# Patient Record
Sex: Female | Born: 1980 | Race: Black or African American | Marital: Married | Smoking: Never smoker
Health system: Southern US, Community
[De-identification: ages and names within clinical notes are randomized; demographics above are authoritative.]

## PROBLEM LIST (undated history)

## (undated) ENCOUNTER — Inpatient Hospital Stay (HOSPITAL_COMMUNITY): Payer: Self-pay

## (undated) DIAGNOSIS — Z789 Other specified health status: Secondary | ICD-10-CM

---

## 2009-11-05 HISTORY — PX: CHOLECYSTECTOMY: SHX55

## 2013-02-03 ENCOUNTER — Other Ambulatory Visit: Payer: Self-pay | Admitting: Orthopedic Surgery

## 2013-02-03 DIAGNOSIS — M79672 Pain in left foot: Secondary | ICD-10-CM

## 2013-02-13 ENCOUNTER — Other Ambulatory Visit: Payer: Self-pay

## 2013-03-17 ENCOUNTER — Ambulatory Visit
Admission: RE | Admit: 2013-03-17 | Discharge: 2013-03-17 | Disposition: A | Discharge status: Home / Self Care | Payer: BC Managed Care – PPO | Source: Ambulatory Visit | Attending: Orthopedic Surgery | Admitting: Orthopedic Surgery

## 2013-03-17 DIAGNOSIS — M79672 Pain in left foot: Secondary | ICD-10-CM

## 2015-02-06 IMAGING — CT CT FOOT*L* W/O CM
5 of 6 series · 15 of 35 positions shown, 17 images · non-contrast
Comparison: None.

CLINICAL DATA: Dorsal and lateral foot pain for 1 year.

CT OF THE LEFT FOOT WITHOUT CONTRAST
TECHNIQUE: Multidetector CT imaging was performed according to the
standard protocol. Multiplanar CT image reconstructions were also
generated.

[Series 2: ankle/foot bone · axial · 0.54mm/px · z∈[-40,+2]mm · 2 of 53 slices shown]
[im 18/53  bone]
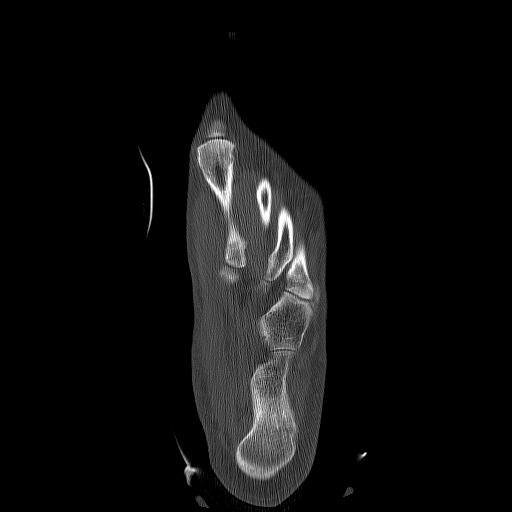
[im 35/53  bone]
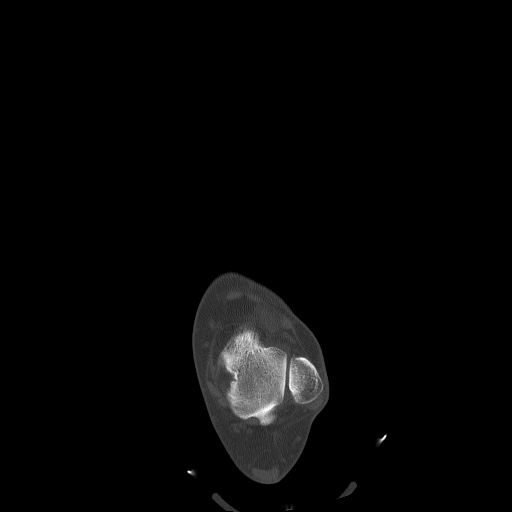

[Series 3: ankle /foot detail · axial · 0.54mm/px · z∈[-40,+2]mm · 2 of 53 slices shown]
[im 18/53  bone]
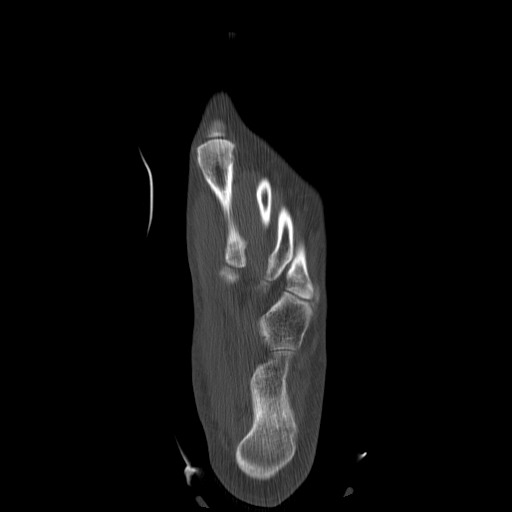
[im 35/53  bone]
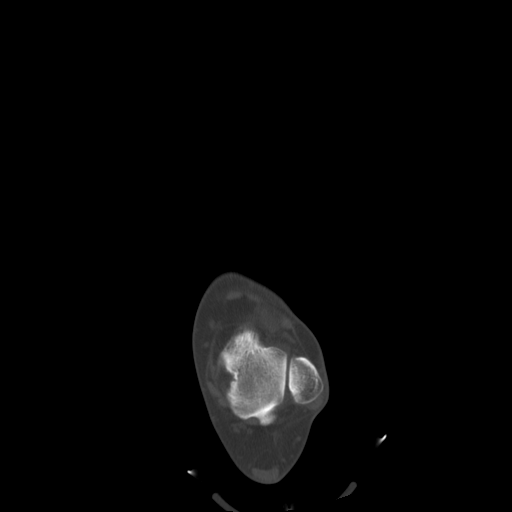

[Series 103: sag soft · sagittal · 0.54mm/px · 5 of 66 slices shown, 6 images]
[im 22/66  bone]
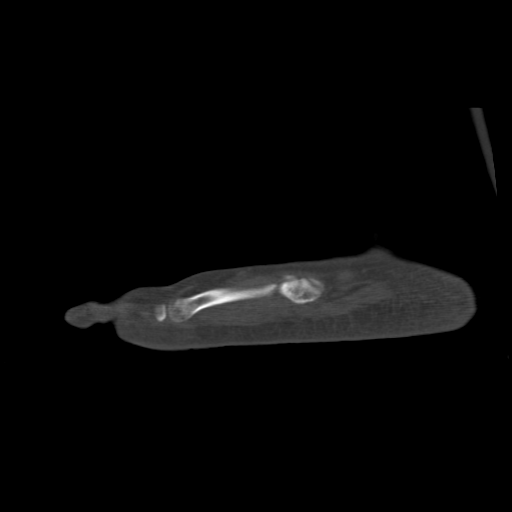
[im 28/66  bone]
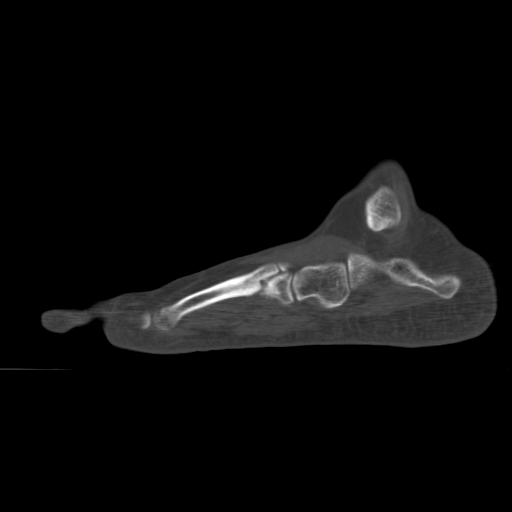
[im 33/66  soft-tissue]
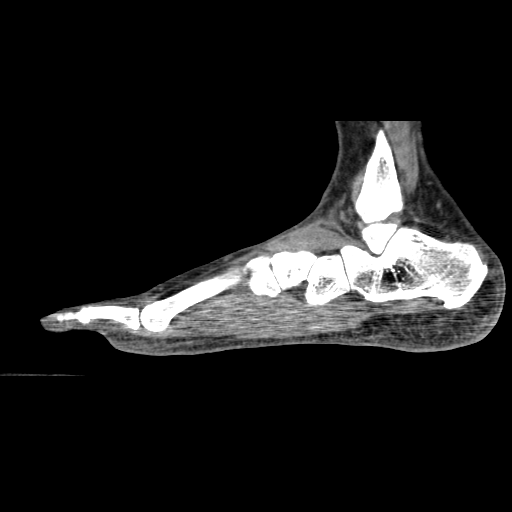
[im 33/66  bone]
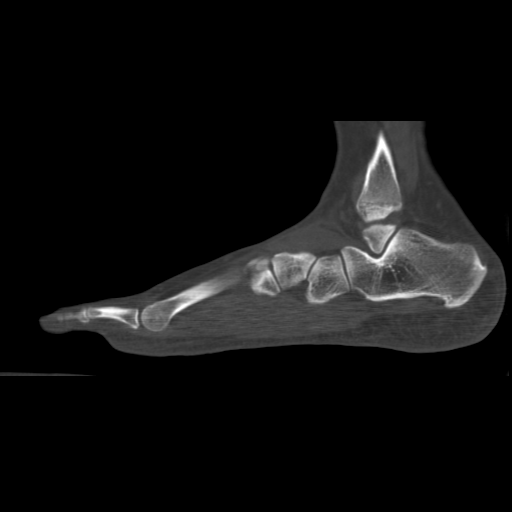
[im 38/66  bone]
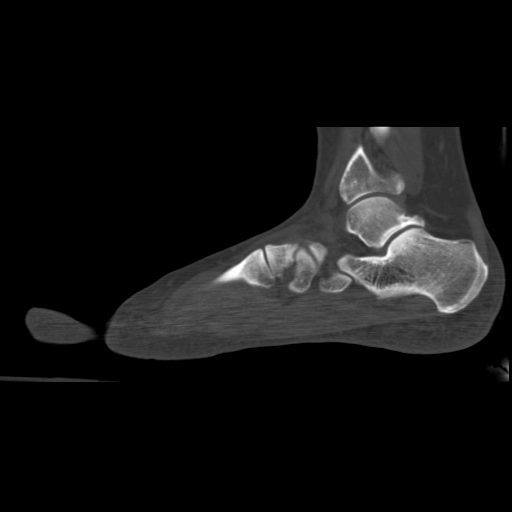
[im 44/66  bone]
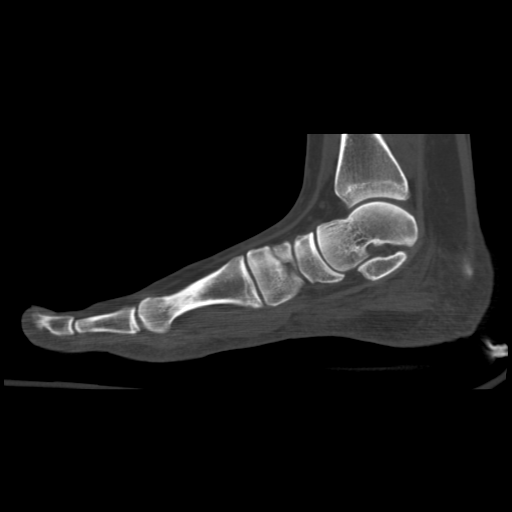

[Series 105: axial soft · coronal · 0.54mm/px · 3 of 132 slices shown]
[im 27/132  bone]
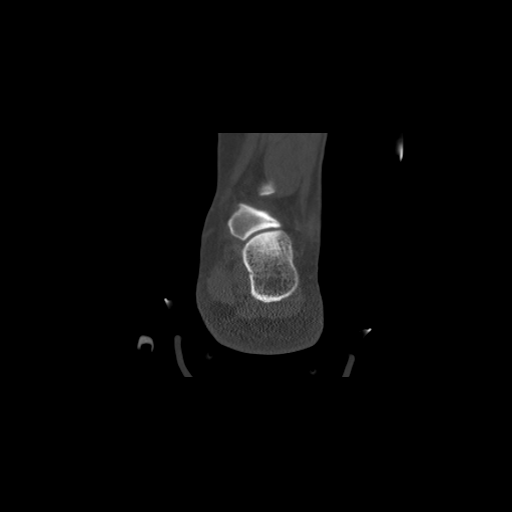
[im 53/132  bone]
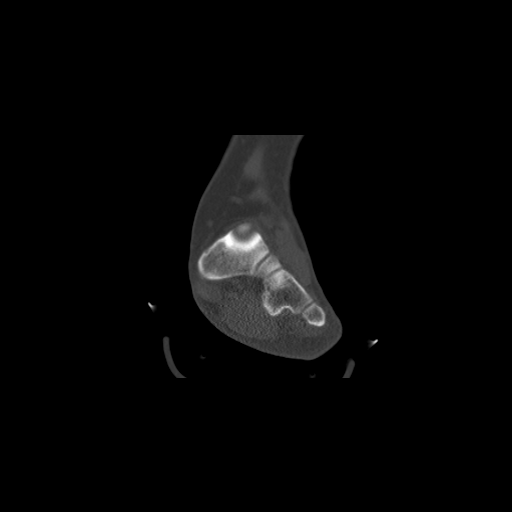
[im 79/132  bone]
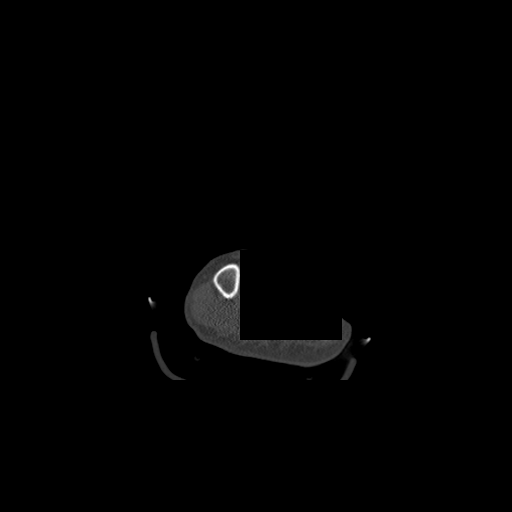

[Series 401: cor bone · axial · 0.54mm/px · z∈[-42,+121]mm · 3 of 49 slices shown, 4 images]
[im 1/49  soft-tissue]
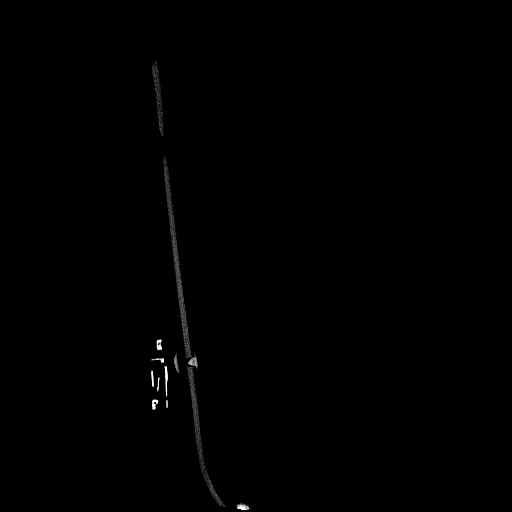
[im 1/49  bone]
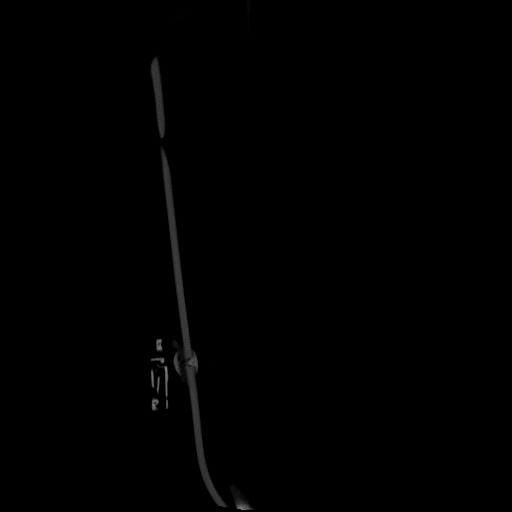
[im 25/49  bone]
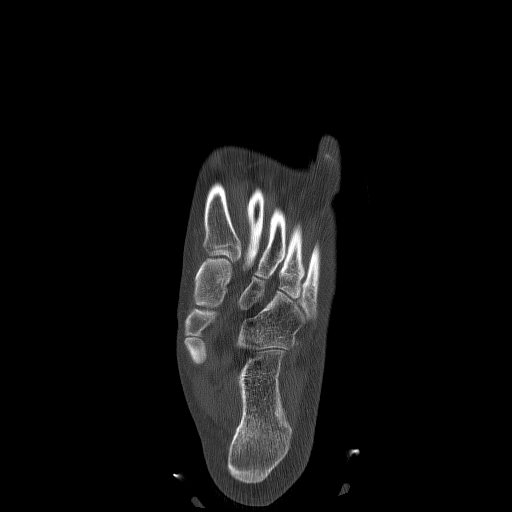
[im 49/49  bone]
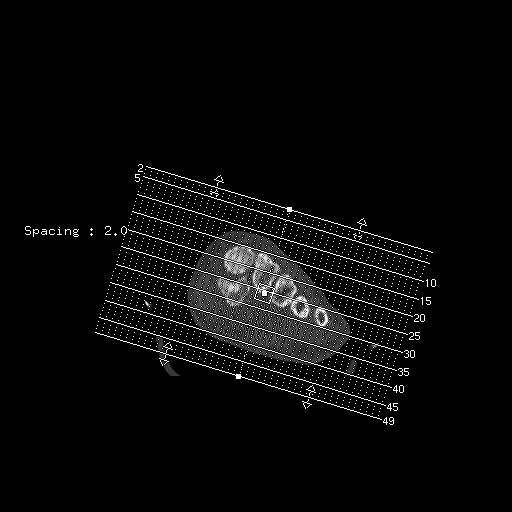

[15 of 35 positions shown; findings below may reference images not displayed]

FINDINGS: The osseous structures are normal with no evidence of
fibrous or osseous coalition.  The patient has a normal variant of
an os naviculare.  The tendons and ligaments of the medial and
lateral aspects of the ankle are normal.  The Achilles tendon and
the anterior tendons are normal.  Plantar fascia appears normal.
No calcaneal spur.

No joint effusions.  Tibiotalar joint is normal.
IMPRESSION: Normal CT scan of the left foot.  No evidence of coalition or other
significant abnormality.

## 2018-11-08 ENCOUNTER — Inpatient Hospital Stay (HOSPITAL_COMMUNITY): Payer: Medicaid Other

## 2018-11-08 ENCOUNTER — Other Ambulatory Visit: Payer: Self-pay

## 2018-11-08 ENCOUNTER — Inpatient Hospital Stay (HOSPITAL_COMMUNITY)
Admission: AD | Admit: 2018-11-08 | Discharge: 2018-11-08 | Disposition: A | Discharge status: Home / Self Care | Payer: Medicaid Other | Attending: Obstetrics & Gynecology | Admitting: Obstetrics & Gynecology

## 2018-11-08 ENCOUNTER — Encounter (HOSPITAL_COMMUNITY): Payer: Self-pay | Admitting: *Deleted

## 2018-11-08 DIAGNOSIS — Z6791 Unspecified blood type, Rh negative: Secondary | ICD-10-CM | POA: Diagnosis not present

## 2018-11-08 DIAGNOSIS — O26893 Other specified pregnancy related conditions, third trimester: Secondary | ICD-10-CM | POA: Diagnosis not present

## 2018-11-08 DIAGNOSIS — Z3A10 10 weeks gestation of pregnancy: Secondary | ICD-10-CM | POA: Diagnosis not present

## 2018-11-08 DIAGNOSIS — Z3A01 Less than 8 weeks gestation of pregnancy: Secondary | ICD-10-CM | POA: Diagnosis not present

## 2018-11-08 DIAGNOSIS — O209 Hemorrhage in early pregnancy, unspecified: Secondary | ICD-10-CM | POA: Diagnosis not present

## 2018-11-08 DIAGNOSIS — O200 Threatened abortion: Secondary | ICD-10-CM | POA: Insufficient documentation

## 2018-11-08 DIAGNOSIS — Z3491 Encounter for supervision of normal pregnancy, unspecified, first trimester: Secondary | ICD-10-CM

## 2018-11-08 DIAGNOSIS — O26891 Other specified pregnancy related conditions, first trimester: Secondary | ICD-10-CM | POA: Diagnosis not present

## 2018-11-08 HISTORY — DX: Other specified health status: Z78.9

## 2018-11-08 LAB — WET PREP, GENITAL
Clue Cells Wet Prep HPF POC: NONE SEEN
Sperm: NONE SEEN
Trich, Wet Prep: NONE SEEN
Yeast Wet Prep HPF POC: NONE SEEN

## 2018-11-08 LAB — CBC
HCT: 41.3 % (ref 36.0–46.0)
Hemoglobin: 13.7 g/dL (ref 12.0–15.0)
MCH: 28.5 pg (ref 26.0–34.0)
MCHC: 33.2 g/dL (ref 30.0–36.0)
MCV: 86 fL (ref 80.0–100.0)
NRBC: 0 % (ref 0.0–0.2)
Platelets: 319 10*3/uL (ref 150–400)
RBC: 4.8 MIL/uL (ref 3.87–5.11)
RDW: 12.8 % (ref 11.5–15.5)
WBC: 6.1 10*3/uL (ref 4.0–10.5)

## 2018-11-08 LAB — URINALYSIS, ROUTINE W REFLEX MICROSCOPIC
BACTERIA UA: NONE SEEN
BILIRUBIN URINE: NEGATIVE
Glucose, UA: NEGATIVE mg/dL
KETONES UR: NEGATIVE mg/dL
Leukocytes, UA: NEGATIVE
Nitrite: NEGATIVE
Protein, ur: NEGATIVE mg/dL
RBC / HPF: >50 RBC/hpf — ABNORMAL HIGH (ref 0–5)
Specific Gravity, Urine: 1.025 (ref 1.005–1.030)
pH: 6 (ref 5.0–8.0)

## 2018-11-08 LAB — HCG, QUANTITATIVE, PREGNANCY: hCG, Beta Chain, Quant, S: 5141 m[IU]/mL — ABNORMAL HIGH (ref <5)

## 2018-11-08 LAB — ABO/RH: ABO/RH(D): B NEG

## 2018-11-08 MED ORDER — RHO D IMMUNE GLOBULIN 1500 UNIT/2ML IJ SOSY
300.0000 ug | PREFILLED_SYRINGE | Freq: Once | INTRAMUSCULAR | Status: AC
  Administered 2018-11-08: 300 ug via INTRAMUSCULAR
  Filled 2018-11-08: qty 2

## 2018-11-08 NOTE — MAU Provider Note (Signed)
Chief Complaint: Vaginal Bleeding   First Provider Initiated Contact with Patient 11/08/18 1143      SUBJECTIVE HPI: Jennifer Jennings is a 38 y.o. G2P2002 at 9w by LMP who presents to maternity admissions reporting positive pregnancy test on November 25 and onset of spotting on 12/23, progressing to light red bleeding 2 days ago requiring a light pad.  The pad is not soaked, but she reports scant blood on her pad.  The bleeding is associated with mild lower abdominal cramping.  There are no other symptoms. She has not tried any treatments.    HPI  Past Medical History:  Diagnosis Date  . Medical history non-contributory    Past Surgical History:  Procedure Laterality Date  . CHOLECYSTECTOMY  2011   Social History   Socioeconomic History  . Marital status: Married    Spouse name: Not on file  . Number of children: Not on file  . Years of education: Not on file  . Highest education level: Not on file  Occupational History  . Not on file  Social Needs  . Financial resource strain: Not on file  . Food insecurity:    Worry: Not on file    Inability: Not on file  . Transportation needs:    Medical: Not on file    Non-medical: Not on file  Tobacco Use  . Smoking status: Never Smoker  . Smokeless tobacco: Never Used  Substance and Sexual Activity  . Alcohol use: Not Currently  . Drug use: Never  . Sexual activity: Not Currently    Birth control/protection: None  Lifestyle  . Physical activity:    Days per week: Not on file    Minutes per session: Not on file  . Stress: Not on file  Relationships  . Social connections:    Talks on phone: Not on file    Gets together: Not on file    Attends religious service: Not on file    Active member of club or organization: Not on file    Attends meetings of clubs or organizations: Not on file    Relationship status: Not on file  . Intimate partner violence:    Fear of current or ex partner: Not on file    Emotionally abused: Not  on file    Physically abused: Not on file    Forced sexual activity: Not on file  Other Topics Concern  . Not on file  Social History Narrative  . Not on file   No current facility-administered medications on file prior to encounter.    No current outpatient medications on file prior to encounter.   No Known Allergies  ROS:  Review of Systems  Constitutional: Negative for chills and fever.  Respiratory: Negative for cough and shortness of breath.   Cardiovascular: Negative for chest pain.  Gastrointestinal: Negative for nausea and vomiting.  Genitourinary: Positive for pelvic pain and vaginal bleeding. Negative for dysuria, frequency and urgency.  Musculoskeletal: Negative.   Neurological: Negative for dizziness and headaches.     I have reviewed patient's Past Medical Hx, Surgical Hx, Family Hx, Social Hx, medications and allergies.   Physical Exam   Patient Vitals for the past 24 hrs:  BP Temp Temp src Pulse Resp SpO2 Weight  11/08/18 1047 (!) 143/83 98.1 F (36.7 C) Oral 96 18 96 % 129.8 kg   Constitutional: Well-developed, well-nourished female in no acute distress.  Cardiovascular: normal rate Respiratory: normal effort GI: Abd soft, non-tender. Pos BS x 4  MS: Extremities nontender, no edema, normal ROM Neurologic: Alert and oriented x 4.  GU: Neg CVAT.  PELVIC EXAM: Cervix pink, visually closed, without lesion, moderate bleeding requiring 2 fox swabs to visualize cervix, vaginal walls and external genitalia normal Bimanual exam: Cervix 0/long/high, firm, anterior, neg CMT, uterus nontender, nonenlarged, adnexa without tenderness, enlargement, or mass   LAB RESULTS Results for orders placed or performed during the hospital encounter of 11/08/18 (from the past 24 hour(s))  CBC     Status: None   Collection Time: 11/08/18 11:52 AM  Result Value Ref Range   WBC 6.1 4.0 - 10.5 K/uL   RBC 4.80 3.87 - 5.11 MIL/uL   Hemoglobin 13.7 12.0 - 15.0 g/dL   HCT 16.141.3 09.636.0  - 04.546.0 %   MCV 86.0 80.0 - 100.0 fL   MCH 28.5 26.0 - 34.0 pg   MCHC 33.2 30.0 - 36.0 g/dL   RDW 40.912.8 81.111.5 - 91.415.5 %   Platelets 319 150 - 400 K/uL   nRBC 0.0 0.0 - 0.2 %  hCG, quantitative, pregnancy     Status: Abnormal   Collection Time: 11/08/18 11:52 AM  Result Value Ref Range   hCG, Beta Chain, Quant, S 5,141 (H) <5 mIU/mL  ABO/Rh     Status: None   Collection Time: 11/08/18 11:52 AM  Result Value Ref Range   ABO/RH(D)      B NEG Performed at [facility], [address] [place], [place] [address]   Rh IG workup (includes ABO/Rh)     Status: None (Preliminary result)   Collection Time: 11/08/18 11:52 AM  Result Value Ref Range   Gestational Age(Wks) 9    ABO/RH(D)      B NEG Performed at [facility], [address] [place], [place] [address]    Antibody Screen PENDING   Urinalysis, Routine w reflex microscopic     Status: Abnormal   Collection Time: 11/08/18 12:17 PM  Result Value Ref Range   Color, Urine YELLOW YELLOW   APPearance HAZY (A) CLEAR   Specific Gravity, Urine 1.025 1.005 - 1.030   pH 6.0 5.0 - 8.0   Glucose, UA NEGATIVE NEGATIVE mg/dL   Hgb urine dipstick LARGE (A) NEGATIVE   Bilirubin Urine NEGATIVE NEGATIVE   Ketones, ur NEGATIVE NEGATIVE mg/dL   Protein, ur NEGATIVE NEGATIVE mg/dL   Nitrite NEGATIVE NEGATIVE   Leukocytes, UA NEGATIVE NEGATIVE   RBC / HPF >50 (H) 0 - 5 RBC/hpf   WBC, UA 6-10 0 - 5 WBC/hpf   Bacteria, UA NONE SEEN NONE SEEN   Squamous Epithelial / LPF 0-5 0 - 5   Mucus PRESENT   Wet prep, genital     Status: Abnormal   Collection Time: 11/08/18  1:27 PM  Result Value Ref Range   Yeast Wet Prep HPF POC NONE SEEN NONE SEEN   Trich, Wet Prep NONE SEEN NONE SEEN   Clue Cells Wet Prep HPF POC NONE SEEN NONE SEEN   WBC, Wet Prep HPF POC FEW (A) NONE SEEN   Sperm NONE SEEN     --/--/B NEG Performed at [facility], [address] [place], [place] [address] , B NEG Performed at [facility], [address] [place], [place] [address]  [phone](01/04 1152)  IMAGING [place] Ob Less Than 14 Weeks With Ob Transvaginal  Result Date: 11/08/2018 CLINICAL DATA:  Vaginal bleeding. Pregnant patient. Beta HCG level 5,141. Patient is 10 weeks and 6 days pregnant based on the last  menstrual period. EXAM: OBSTETRIC <14 WK US AND TRANSVAGINAL OB US TECHNIQUE: Both transabdominal and transvaginal ultrasound examinations were performed for complete evaluation of the gestation as well as the maternal uterus, adnexal regions, and pelvic cul-de-sac. Transvaginal technique was performed to assess early pregnancy. COMPARISON:  None. FINDINGS: Intrauterine gestational sac: Single Yolk sac:  Visualized. Embryo:  Visualized. Cardiac Activity: Not Visualized. MSD: 24.6 mm   7 w   3 d CRL:  4.9 mm   6 w   1 d                  US EDC: 06/29/2019 Subchorionic hemorrhage:  None visualized. Maternal uterus/adnexae: No uterine masses. Cervix unremarkable. Normal ovaries and adnexa. No pelvic free fluid. IMPRESSION: 1. Gestational sac, yolk sac and small embryo, but no detectable cardiac activity. Findings are suspicious but not yet definitive for failed pregnancy. Recommend follow-up US in 10-14 days for definitive diagnosis. This recommendation follows SRU consensus guidelines: Diagnostic Criteria for Nonviable Pregnancy Early in the First Trimester. Malva Limes Engl J Med 2013; 161:0960-45; 369:1443-51. 2. No subchorionic hemorrhage or uterine abnormality. Normal ovaries and adnexa. Electronically Signed   By: Amie Portlandavid  Ormond M.D.   On: 11/08/2018 16:19    MAU Management/MDM: Ordered labs and US and reviewed results.  US indicates IUP but at 1322w1d, this is inconsistent with pt sure LMP and known conception dates.  Likely SAB but not definitive. Outpatient US in 10 days with [facility] for results.  Return to MAU sooner if increased pain or bleeding.  Blood type is B negative so rhophylac given in MAU today.  Pt discharged with strict return  precautions.  ASSESSMENT 1. Threatened miscarriage in early pregnancy   2. Vaginal bleeding in pregnancy, first trimester   3. Normal IUP (intrauterine pregnancy) on prenatal ultrasound, first trimester   4. Rh negative state in antepartum period, first trimester     PLAN Discharge home Allergies as of 11/08/2018   No Known Allergies     Medication List    You have not been prescribed any medications.    Follow-up Information    [facility] Follow up.   Specialty:  Radiology Why:  Ultrasound will call you to schedule follow up imaging in 10 days.   Contact information: [address] [id number] [place] [address] [phone]       [facility] Follow up.   Why:  Return to MAU as needed for emergencies. Contact information: [address] [place] [phone]          Sharen CounterLisa Leftwich-Kirby Certified Nurse-Midwife 11/08/2018  6:20 PM

## 2018-11-08 NOTE — MAU Note (Addendum)
Jennifer Jennings is a 38 y.o.  here in MAU reporting: +vaginal bleeding. Started as spotting now more and noticeable each times she goes to the restroom. Tan in color to red now. LMP: 08/24/18 Onset of complaint: started 12/27 Pain score: denies at this time. Does report menstrual like cramps at night. Vitals:   11/08/18 1047  BP: (!) 143/83  Pulse: 96  Resp: 18  Temp: 98.1 F (36.7 C)  SpO2: 96%    1st appt set with [facility] on the 22nd (currently awaiting insurance). Denies being seen in the last year. Lab orders placed from triage: ua and pregnancy

## 2018-11-09 LAB — RH IG WORKUP (INCLUDES ABO/RH)
ABO/RH(D): B NEG
Antibody Screen: NEGATIVE
Gestational Age(Wks): 9
Unit division: 0

## 2018-11-09 LAB — HIV ANTIBODY (ROUTINE TESTING W REFLEX): HIV SCREEN 4TH GENERATION: NONREACTIVE

## 2018-11-10 ENCOUNTER — Inpatient Hospital Stay (HOSPITAL_COMMUNITY)
Admission: AD | Admit: 2018-11-10 | Discharge: 2018-11-10 | Disposition: A | Discharge status: Home / Self Care | Payer: Medicaid Other | Source: Ambulatory Visit | Attending: Family Medicine | Admitting: Family Medicine

## 2018-11-10 ENCOUNTER — Encounter (HOSPITAL_COMMUNITY): Payer: Self-pay | Admitting: *Deleted

## 2018-11-10 ENCOUNTER — Other Ambulatory Visit: Payer: Self-pay

## 2018-11-10 DIAGNOSIS — O209 Hemorrhage in early pregnancy, unspecified: Secondary | ICD-10-CM | POA: Diagnosis present

## 2018-11-10 DIAGNOSIS — O039 Complete or unspecified spontaneous abortion without complication: Secondary | ICD-10-CM

## 2018-11-10 LAB — CBC
HCT: 40.4 % (ref 36.0–46.0)
HEMOGLOBIN: 13.2 g/dL (ref 12.0–15.0)
MCH: 28.1 pg (ref 26.0–34.0)
MCHC: 32.7 g/dL (ref 30.0–36.0)
MCV: 86.1 fL (ref 80.0–100.0)
Platelets: 309 10*3/uL (ref 150–400)
RBC: 4.69 MIL/uL (ref 3.87–5.11)
RDW: 12.8 % (ref 11.5–15.5)
WBC: 6.6 10*3/uL (ref 4.0–10.5)
nRBC: 0 % (ref 0.0–0.2)

## 2018-11-10 LAB — GC/CHLAMYDIA PROBE AMP (~~LOC~~) NOT AT ARMC
Chlamydia: NEGATIVE
Neisseria Gonorrhea: NEGATIVE

## 2018-11-10 LAB — HCG, QUANTITATIVE, PREGNANCY: hCG, Beta Chain, Quant, S: 2347 m[IU]/mL — ABNORMAL HIGH (ref <5)

## 2018-11-10 NOTE — MAU Provider Note (Addendum)
History     CSN: [id number]  Arrival date and time: 11/10/18 1445   First Provider Initiated Contact with Patient 11/10/18 1558      Chief Complaint  Patient presents with  . Vaginal Bleeding   Jennifer Jennings is a 38y.o. G3P2002 at 6.3wks who presents for possible miscarriage.  She reports passing of gestational sac early afternoon (1215).  She denies pain and reports that her bleeding has "lightened up" since passing of the gestational sac.  Patient reports that she brought in the gestational sac.  She denies nausea/vomiting, problems with urination, or constipation/diarrhea.      OB History    Gravida  3   Para  2   Term  2   Preterm      AB      Living  2     SAB      TAB      Ectopic      Multiple      Live Births  2           Past Medical History:  Diagnosis Date  . Medical history non-contributory     Past Surgical History:  Procedure Laterality Date  . CHOLECYSTECTOMY  2011    History reviewed. No pertinent family history.  Social History   Tobacco Use  . Smoking status: Never Smoker  . Smokeless tobacco: Never Used  Substance Use Topics  . Alcohol use: Not Currently  . Drug use: Never    Allergies:  Allergies  Allergen Reactions  . Adhesive [Tape] Rash    No medications prior to admission.    Review of Systems  Constitutional: Negative for chills and fever.  Respiratory: Negative for shortness of breath.   Gastrointestinal: Positive for abdominal pain. Negative for constipation, diarrhea, nausea and vomiting.  Genitourinary: Positive for vaginal bleeding. Negative for difficulty urinating, dysuria, pelvic pain and vaginal pain.  Neurological: Negative for dizziness and light-headedness.   Physical Exam   Blood pressure 135/84, pulse 92, temperature 98.1 F (36.7 C), temperature source Oral, resp. rate 20, height 5\' 6"  (1.676 m), weight 130.3 kg, last menstrual period 08/24/2018, SpO2 100 %.  Physical Exam   Constitutional: She is oriented to person, place, and time. She appears well-developed and well-nourished.  HENT:  Head: Normocephalic and atraumatic.  Eyes: Conjunctivae are normal.  Neck: Normal range of motion.  Cardiovascular: Normal rate, regular rhythm and normal heart sounds.  Respiratory: Effort normal and breath sounds normal.  GI: Soft. Bowel sounds are normal.  Musculoskeletal: Normal range of motion.  Neurological: She is alert and oriented to person, place, and time.  Skin: Skin is warm and dry.  Psychiatric: She has a normal mood and affect. Her behavior is normal.   Results for orders placed or performed during the hospital encounter of 11/10/18 (from the past 24 hour(s))  CBC     Status: None   Collection Time: 11/10/18  4:15 PM  Result Value Ref Range   WBC 6.6 4.0 - 10.5 K/uL   RBC 4.69 3.87 - 5.11 MIL/uL   Hemoglobin 13.2 12.0 - 15.0 g/dL   HCT 54.2 70.6 - 23.7 %   MCV 86.1 80.0 - 100.0 fL   MCH 28.1 26.0 - 34.0 pg   MCHC 32.7 30.0 - 36.0 g/dL   RDW 62.8 31.5 - 17.6 %   Platelets 309 150 - 400 K/uL   nRBC 0.0 0.0 - 0.2 %  hCG, quantitative, pregnancy     Status: Abnormal  Collection Time: 11/10/18  4:15 PM  Result Value Ref Range   hCG, Beta Chain, Quant, S 2,347 (H) <5 mIU/mL    MAU Course  Procedures  MDM Pelvic Exam Labs: UA, CBC, hCG   Assessment and Plan  Complete Miscarriage  -Condolences given -Pelvic exam declined -UA deferred due to inadequate specimen -CBC and hCG pending -Patient requests to keep gestational sac for home burial services -Picture of gestational sac taken and added to media -Patient requests picture, from original ultrasound, and tech contacted. -Patient offered and declined pain medication -Informed of need to follow up for repeat hCG in ~2 weeks in clinic -Will send message for scheduling of follow up appt. -Discharged to home in stable condition  Cherre Robins MSN, CNM 11/10/2018, 3:59 PM

## 2018-11-10 NOTE — Discharge Instructions (Signed)
Coping with Pregnancy Loss  Pregnancy loss can happen any time during a pregnancy. Often the cause is not known. It is rarely because of anything you did. Pregnancy loss in early pregnancy (during the first trimester) is called a miscarriage. This type of pregnancy loss is the most common. Pregnancy loss that happens after 20 weeks of pregnancy is called fetal demise if the baby's heart stops beating before birth. Fetal demise is much less common. Some women experience spontaneous labor shortly after fetal demise resulting in a stillborn birth (stillbirth).  Any pregnancy loss can be devastating. You will need to recover both physically and emotionally. Most women are able to get pregnant again after a pregnancy loss and deliver a healthy baby.  How to manage emotional recovery    Pregnancy loss is very hard emotionally. You may feel many different emotions while you grieve. You may feel sad and angry. You may also feel guilty. It is normal to have periods of crying. Emotional recovery can take longer than physical recovery. It is different for everyone.  Taking these steps can help you cope:  · Remember that it is unlikely you did anything to cause the pregnancy loss.  · Share your thoughts and feelings with friends, family, and your partner. Remember that your partner is also recovering emotionally.  · Make sure you have a good support system, and do not spend too much time alone.  · Meet with a pregnancy loss counselor or join a pregnancy loss support group.  · Get enough sleep and eat a healthy diet. Return to regular exercise when you have recovered physically.  · Do not use drugs or alcohol to manage your emotions.  · Consider seeing a mental health professional to help you recover emotionally.  · Ask a friend or loved one to help you decide what to do with any clothing and nursery items you received for your baby.  In the case of a stillbirth, many women benefit from taking additional steps in the grieving  process. You may want to:  · Hold your baby after the birth.  · Name your baby.  · Request a birth certificate.  · Create a keepsake such as handprints or footprints.  · Dress your baby and have a picture taken.  · Make funeral arrangements.  · Ask for a baptism or blessing.  Hospitals have staff members who can help you with all these arrangements.  How to recognize emotional stress  It is normal to have emotional stress after a pregnancy loss. But emotional stress that lasts a long time or becomes severe requires treatment. Watch out for these signs of severe emotional stress:  · Sadness, anger, or guilt that is not going away and is interfering with your normal activities.  · Relationship problems that have occurred or gotten worse since the pregnancy loss.  · Signs of depression that last longer than 2 weeks. These may include:  ? Sadness.  ? Anxiety.  ? Hopelessness.  ? Loss of interest in activities you enjoy.  ? Inability to concentrate.  ? Trouble sleeping or sleeping too much.  ? Loss of appetite or overeating.  ? Thoughts of death or of hurting yourself.  Follow these instructions at home:  Medicines  · Take over-the-counter and prescription medicines only as told by your health care provider.  Activity  · Rest at home until your energy level returns. Return to your normal activities as told by your health care provider. Ask your health care   provider what activities are safe for you.  General instructions  · Keep all follow-up visits as told by your health care provider. This is important.  · It may be helpful to meet with others who have experienced pregnancy loss. Ask your health care provider about support groups and resources.  · To help you and your partner with the process of grieving, talk with your health care provider or seek counseling.  · When you are ready, meet with your health care provider to discuss steps to take for a future pregnancy.  Where to find more information  · U.S. Department of  Health and Human Services Office on Women's Health: www.womenshealth.gov  · American Pregnancy Association: www.americanpregnancy.org  Contact a health care provider if:  · You continue to experience grief, sadness, or lack of motivation for everyday activities, and those feelings do not improve over time.  · You are struggling to recover emotionally, especially if you are using alcohol or substances to help.  Get help right away if:  · You have thoughts of hurting yourself or others.  If you ever feel like you may hurt yourself or others, or have thoughts about taking your own life, get help right away. You can go to your nearest emergency department or call:  · Your local emergency services (911 in the [place]  · A suicide crisis helpline, such as the National Suicide Prevention Lifeline at 1-800-273-8255. This is open 24 hours a day.  Summary  · Any pregnancy loss can be difficult physically and emotionally.  · You may experience many different emotions while you grieve. Emotional recovery can last longer than physical recovery.  · It is normal to have emotional stress after a pregnancy loss. But emotional stress that lasts a long time or becomes severe requires treatment.  · See your health care provider if you are struggling emotionally after a pregnancy loss.  This information is not intended to replace advice given to you by your health care provider. Make sure you discuss any questions you have with your health care provider.  Document Released: 01/02/2018 Document Revised: 01/02/2018 Document Reviewed: 01/02/2018  Elsevier Interactive Patient Education © 2019 Elsevier Inc.

## 2018-11-10 NOTE — MAU Note (Signed)
Pt presents with c/o miscarriage this morning.  Reports passed sac @ 1150.  States was seen Saturday, had U/S, reports was measuring 6 weeks 1 day and should have been @ least 9 weeks. Reports moderate amount VB, passing clots

## 2018-11-17 ENCOUNTER — Ambulatory Visit: Payer: Self-pay | Admitting: *Deleted

## 2018-11-17 DIAGNOSIS — O039 Complete or unspecified spontaneous abortion without complication: Secondary | ICD-10-CM | POA: Diagnosis not present

## 2018-11-18 LAB — BETA HCG QUANT (REF LAB): hCG Quant: 71 m[IU]/mL

## 2018-11-25 ENCOUNTER — Ambulatory Visit (INDEPENDENT_AMBULATORY_CARE_PROVIDER_SITE_OTHER): Payer: Medicaid Other | Admitting: Advanced Practice Midwife

## 2018-11-25 ENCOUNTER — Encounter: Payer: Self-pay | Admitting: Advanced Practice Midwife

## 2018-11-25 VITALS — BP 150/92 | HR 86 | Wt 288.0 lb

## 2018-11-25 DIAGNOSIS — O039 Complete or unspecified spontaneous abortion without complication: Secondary | ICD-10-CM

## 2018-11-25 DIAGNOSIS — R030 Elevated blood-pressure reading, without diagnosis of hypertension: Secondary | ICD-10-CM | POA: Diagnosis not present

## 2018-11-25 DIAGNOSIS — Z832 Family history of diseases of the blood and blood-forming organs and certain disorders involving the immune mechanism: Secondary | ICD-10-CM

## 2018-11-25 DIAGNOSIS — Z8759 Personal history of other complications of pregnancy, childbirth and the puerperium: Secondary | ICD-10-CM

## 2018-11-25 NOTE — Progress Notes (Signed)
Patient is here to F/U Miscarriage on 11/10/18 B/P Elevated pt states she has no complaints and she feels fine. Pt denies any pain or bleeding pt states she never experienced any pain  During SAB either.

## 2018-11-25 NOTE — Progress Notes (Signed)
  GYNECOLOGY PROGRESS NOTE  History:  38 y.o. G3P2002 presents to [facility] office today for miscarriage follow up visit. She initially presented 11/08/18 with bleeding, [place] confirmed IUP at [redacted]w[redacted]d, without FHR. Dates were inconsistent with pt sure LMP. Quant hcg on 1/6 was 5,147. Pt presented again to MAU on 11/10/18 after passing gestational sac at home.  Quant hcg dropped to 2,347 on 11/08/18, c/w SAB.  She returned to the office 11/17/18 and had quant hcg down to 71.    She reports no pain or bleeding today. She has no hx of HTN and denies any h/a, chest pain, shortness of breath or dizziness.  She does report concerns that she has a family history of DVT/PE and her father and older sister have tested positive for a genetic mutation increasing risk of blood clots.  She is starting some lifestyle changes including exercise and diet this week but wants to get testing for this gene mutation if possible.    The following portions of the patient's history were reviewed and updated as appropriate: allergies, current medications, past family history, past medical history, past social history, past surgical history and problem list.  Review of Systems:  Pertinent items are noted in HPI.   Objective:  Physical Exam Blood pressure (!) 150/92, pulse 86, weight 130.6 kg, last menstrual period 08/31/2018.   VS reviewed, nursing note reviewed,  Constitutional: well developed, well nourished, no distress HEENT: normocephalic CV: normal rate Pulm/chest wall: normal effort Breast Exam: deferred Abdomen: soft Neuro: alert and oriented x 3 Skin: warm, dry Psych: affect normal Pelvic exam: Cervix pink, visually closed, without lesion, scant white creamy discharge, vaginal walls and external genitalia normal Bimanual exam: Cervix 0/long/high, firm, anterior, neg CMT, uterus nontender, nonenlarged, adnexa without tenderness, enlargement, or mass  Assessment & Plan:  1. SAB (spontaneous abortion) --Pt doing well with  no pain or bleeding.  Last hcg 71 so likely last draw today.   - Beta hCG quant (ref lab)  2. Family history of bleeding or clotting disorder - Prothrombin Gene Mutation  3. Transient hypertension --BP elevated, borderline at initial presentation for SAB on 11/08/18 but high normal on 11/10/18.  Elevated today. --Pt does not have primary care due to lack of insurance --Encouraged obtaining primary care provider --Pt reports weight gain since miscarriage due to stress eating and thinks this is contributing. She is working on this with diet and exercise.   Sharen Counter, CNM 3:02 PM

## 2018-11-25 NOTE — Patient Instructions (Signed)
Miscarriage  A miscarriage is the loss of an unborn baby (fetus) before the 20th week of pregnancy. Most miscarriages happen during the first 3 months of pregnancy. Sometimes, a miscarriage can happen before a woman knows that she is pregnant.  Having a miscarriage can be an emotional experience. If you have had a miscarriage, talk with your health care provider about any questions you may have about miscarrying, the grieving process, and your plans for future pregnancy.  What are the causes?  A miscarriage may be caused by:  · Problems with the genes or chromosomes of the fetus. These problems make it impossible for the baby to develop normally. They are often the result of random errors that occur early in the development of the baby, and are not passed from parent to child (not inherited).  · Infection of the cervix or uterus.  · Conditions that affect hormone balance in the body.  · Problems with the cervix, such as the cervix opening and thinning before pregnancy is at term (cervical insufficiency).  · Problems with the uterus. These may include:  ? A uterus with an abnormal shape.  ? Fibroids in the uterus.  ? Congenital abnormalities. These are problems that were present at birth.  · Certain medical conditions.  · Smoking, drinking alcohol, or using drugs.  · Injury (trauma).  In many cases, the cause of a miscarriage is not known.  What are the signs or symptoms?  Symptoms of this condition include:  · Vaginal bleeding or spotting, with or without cramps or pain.  · Pain or cramping in the abdomen or lower back.  · Passing fluid, tissue, or blood clots from the vagina.  How is this diagnosed?  This condition may be diagnosed based on:  · A physical exam.  · Ultrasound.  · Blood tests.  · Urine tests.  How is this treated?  Treatment for a miscarriage is sometimes not necessary if you naturally pass all the tissue that was in your uterus. If necessary, this condition may be treated with:  · Dilation and  curettage (D&C). This is a procedure in which the cervix is stretched open and the lining of the uterus (endometrium) is scraped. This is done only if tissue from the fetus or placenta remains in the body (incomplete miscarriage).  · Medicines, such as:  ? Antibiotic medicine, to treat infection.  ? Medicine to help the body pass any remaining tissue.  ? Medicine to reduce (contract) the size of the uterus. These medicines may be given if you have a lot of bleeding.  If you have Rh negative blood and your baby was Rh positive, you will need a shot of a medicine called Rh immunoglobulinto protect your future babies from Rh blood problems. "Rh-negative" and "Rh-positive" refer to whether or not the blood has a specific protein found on the surface of red blood cells (Rh factor).  Follow these instructions at home:  Medicines    · Take over-the-counter and prescription medicines only as told by your health care provider.  · If you were prescribed antibiotic medicine, take it as told by your health care provider. Do not stop taking the antibiotic even if you start to feel better.  · Do not take NSAIDs, such as aspirin and ibuprofen, unless they are approved by your health care provider. These medicines can cause bleeding.  Activity  · Rest as directed. Ask your health care provider what activities are safe for you.  · Have someone   help with home and family responsibilities during this time.  General instructions  · Keep track of the number of sanitary pads you use each day and how soaked (saturated) they are. Write down this information.  · Monitor the amount of tissue or blood clots that you pass from your vagina. Save any large amounts of tissue for your health care provider to examine.  · Do not use tampons, douche, or have sex until your health care provider approves.  · To help you and your partner with the process of grieving, talk with your health care provider or seek counseling.  · When you are ready, meet with  your health care provider to discuss any important steps you should take for your health. Also, discuss steps you should take to have a healthy pregnancy in the future.  · Keep all follow-up visits as told by your health care provider. This is important.  Where to find more information  · The [employer]: www.acog.org  · [place] Department of Health and Human Services Office of Women’s Health: www.womenshealth.gov  Contact a health care provider if:  · You have a fever or chills.  · You have a foul smelling vaginal discharge.  · You have more bleeding instead of less.  Get help right away if:  · You have severe cramps or pain in your back or abdomen.  · You pass blood clots or tissue from your vagina that is walnut-sized or larger.  · You soak more than 1 regular sanitary pad in an hour.  · You become light-headed or weak.  · You pass out.  · You have feelings of sadness that take over your thoughts, or you have thoughts of hurting yourself.  Summary  · Most miscarriages happen in the first 3 months of pregnancy. Sometimes miscarriage happens before a woman even knows that she is pregnant.  · Follow your health care provider's instruction for home care. Keep all follow-up appointments.  · To help you and your partner with the process of grieving, talk with your health care provider or seek counseling.  This information is not intended to replace advice given to you by your health care provider. Make sure you discuss any questions you have with your health care provider.  Document Released: 04/17/2001 Document Revised: 11/27/2016 Document Reviewed: 11/27/2016  [employer]vier Interactive Patient Education © 2019 [employer]

## 2018-12-02 LAB — PROTHROMBIN GENE MUTATION

## 2018-12-02 LAB — BETA HCG QUANT (REF LAB): hCG Quant: 7 m[IU]/mL

## 2019-09-25 ENCOUNTER — Encounter (HOSPITAL_COMMUNITY): Payer: Self-pay

## 2020-09-29 IMAGING — US US OB < 14 WEEKS - US OB TV
1 series · 15 of 28 positions shown · non-contrast
Comparison: None.

CLINICAL DATA: Vaginal bleeding. Pregnant patient. Beta HCG level
[DATE]. Patient is 10 weeks and 6 days pregnant based on the last
menstrual period.

EXAM:
OBSTETRIC <14 WK US AND TRANSVAGINAL OB US
TECHNIQUE: Both transabdominal and transvaginal ultrasound examinations were
performed for complete evaluation of the gestation as well as the
maternal uterus, adnexal regions, and pelvic cul-de-sac.
Transvaginal technique was performed to assess early pregnancy.

[Series 1: us ob < 14 weeks - us ob tv · 15 of 65 slices shown]
[im 1/65]
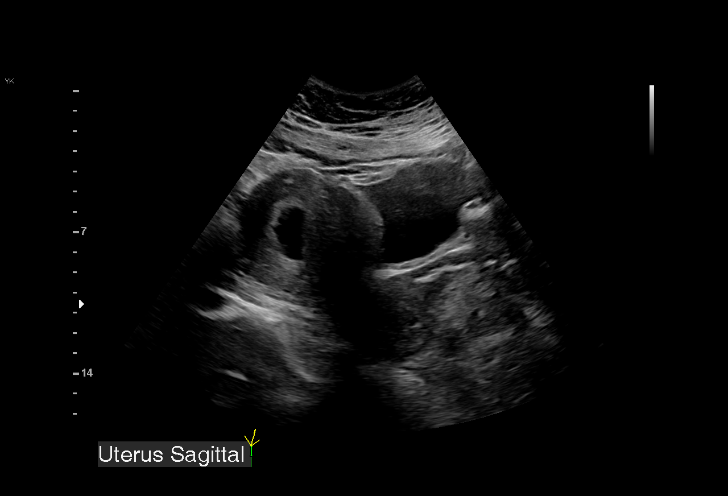
[im 5/65]
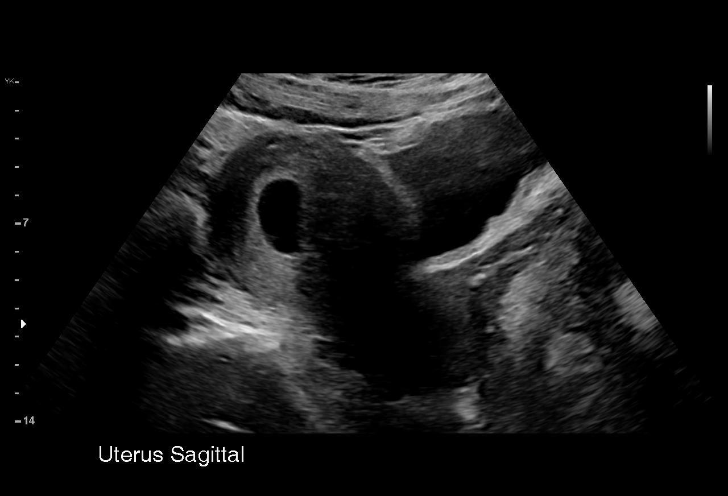
[im 10/65]
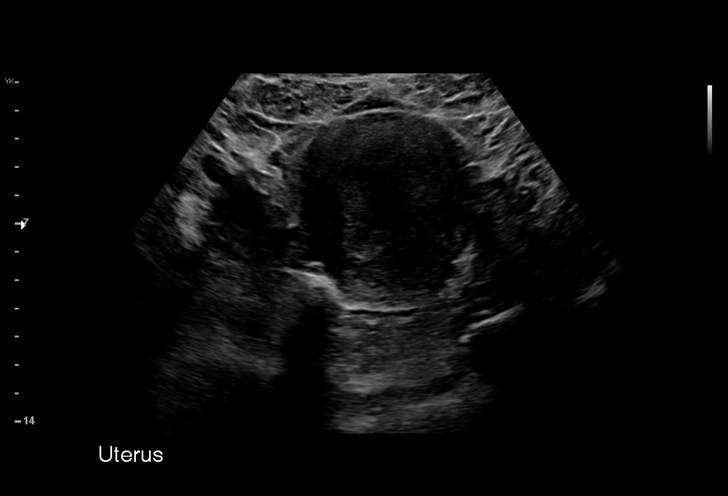
[im 15/65]
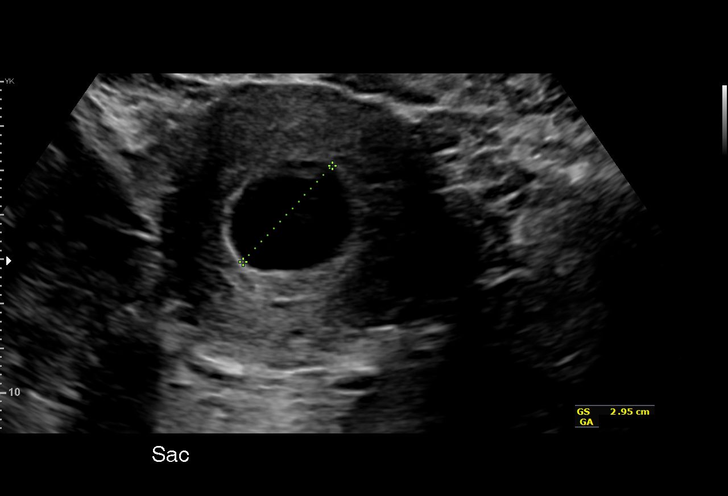
[im 19/65]
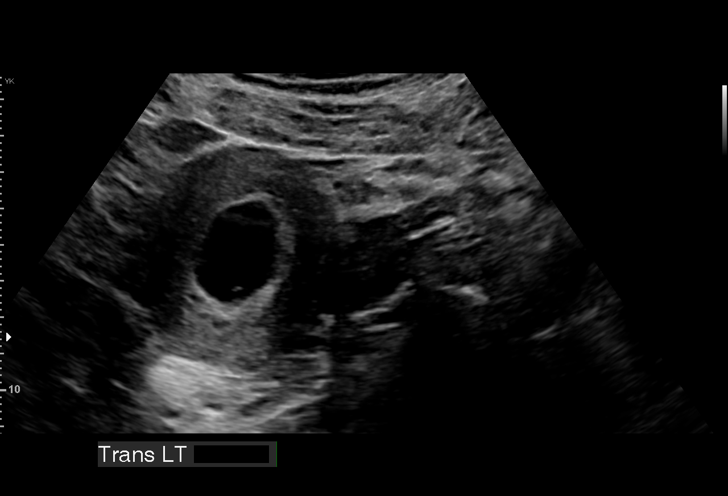
[im 24/65]
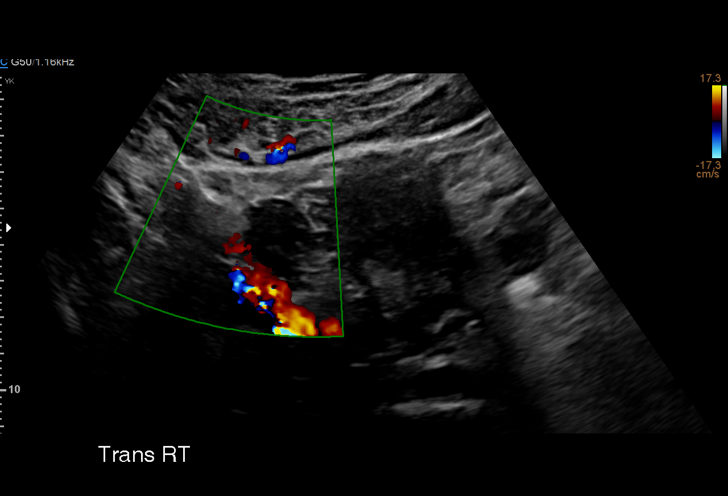
[im 29/65]
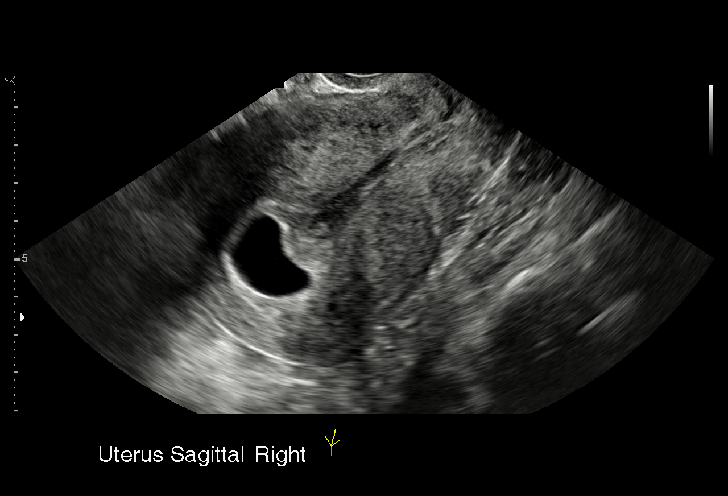
[im 34/65]
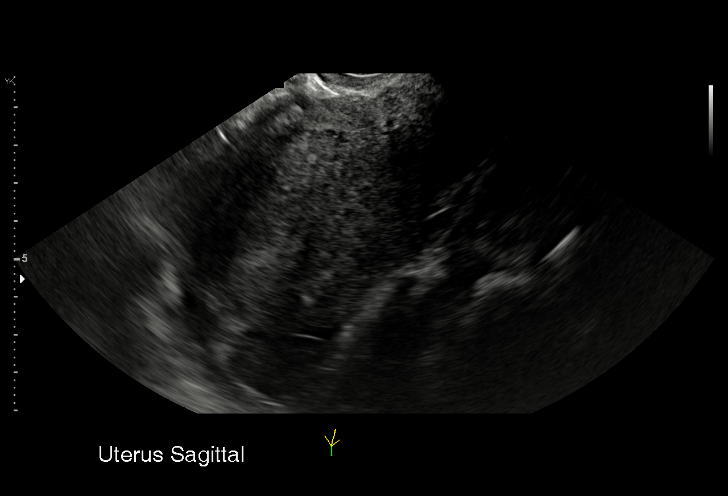
[im 36/65]
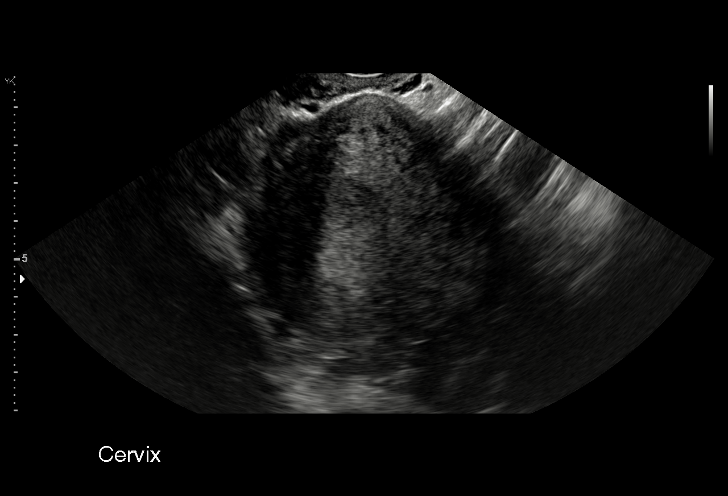
[im 41/65]
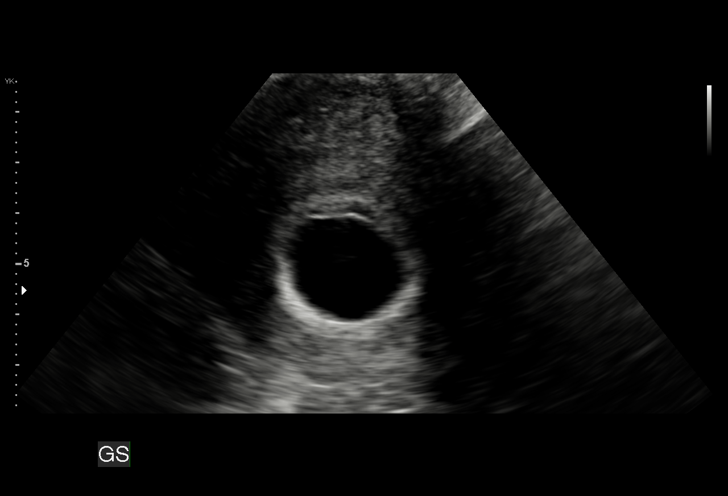
[im 46/65]
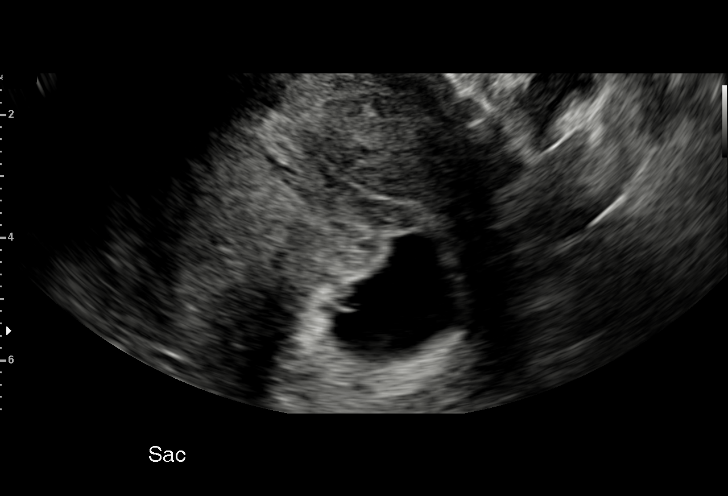
[im 50/65]
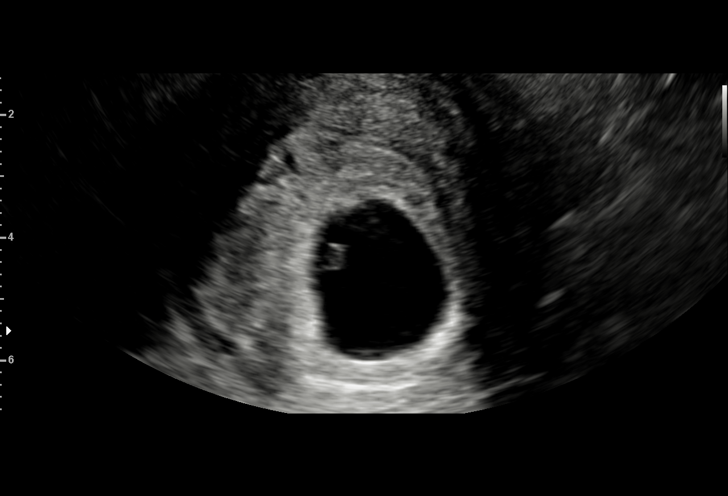
[im 55/65]
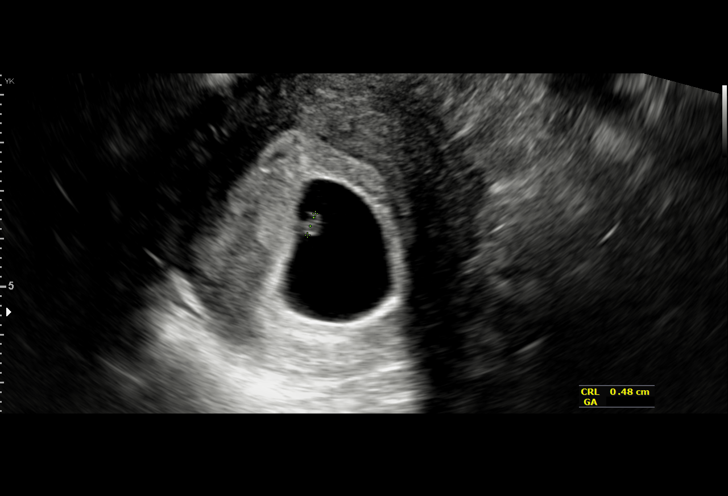
[im 60/65]
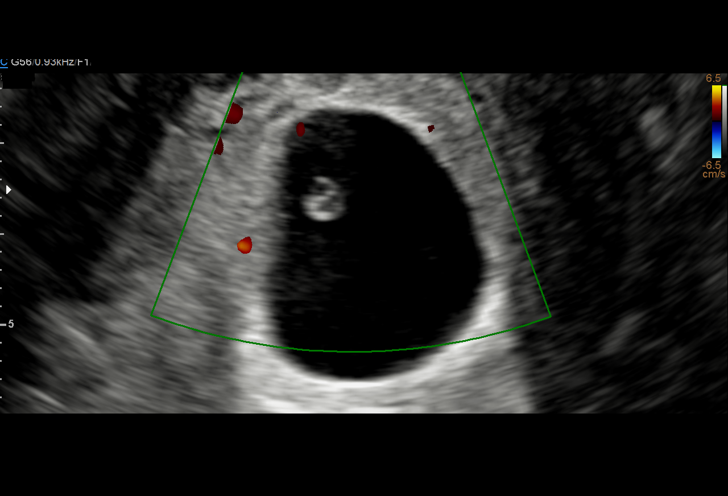
[im 65/65]
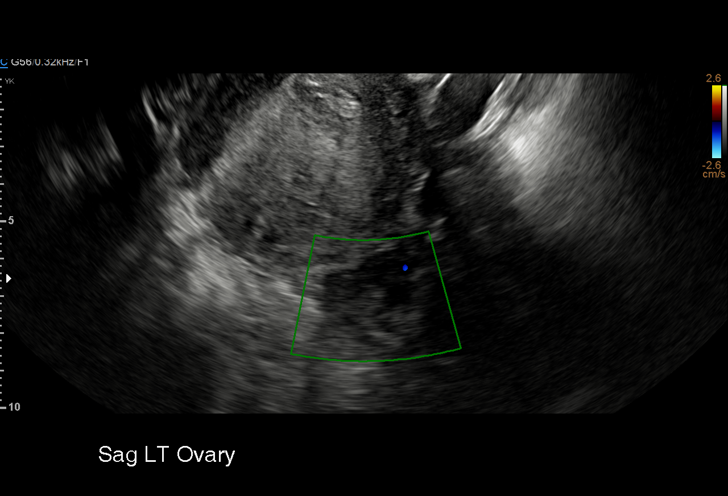

[15 of 28 positions shown; findings below may reference images not displayed]

FINDINGS: Intrauterine gestational sac: Single

Yolk sac:  Visualized.

Embryo:  Visualized.

Cardiac Activity: Not Visualized.

MSD: 24.6 mm   7 w   3 d

CRL:  4.9 mm   6 w   1 d                  US EDC: 06/29/2019

Subchorionic hemorrhage:  None visualized.

Maternal uterus/adnexae: No uterine masses. Cervix unremarkable.
Normal ovaries and adnexa. No pelvic free fluid.
IMPRESSION: 1. Gestational sac, yolk sac and small embryo, but no detectable
cardiac activity. Findings are suspicious but not yet definitive for
failed pregnancy. Recommend follow-up US in 10-14 days for
definitive diagnosis. This recommendation follows [facility] consensus
guidelines: Diagnostic Criteria for Nonviable Pregnancy Early in the
First Trimester. N Engl J Med 9247; [DATE].
2. No subchorionic hemorrhage or uterine abnormality. Normal ovaries
and adnexa.

## 2022-05-11 DIAGNOSIS — Z6841 Body Mass Index (BMI) 40.0 and over, adult: Secondary | ICD-10-CM | POA: Diagnosis not present

## 2022-05-11 DIAGNOSIS — R635 Abnormal weight gain: Secondary | ICD-10-CM | POA: Diagnosis not present

## 2022-05-11 DIAGNOSIS — R5383 Other fatigue: Secondary | ICD-10-CM | POA: Diagnosis not present

## 2022-05-11 DIAGNOSIS — L659 Nonscarring hair loss, unspecified: Secondary | ICD-10-CM | POA: Diagnosis not present

## 2022-05-11 DIAGNOSIS — Z130 Encounter for screening for diseases of the blood and blood-forming organs and certain disorders involving the immune mechanism: Secondary | ICD-10-CM | POA: Diagnosis not present

## 2022-05-11 DIAGNOSIS — Z13220 Encounter for screening for lipoid disorders: Secondary | ICD-10-CM | POA: Diagnosis not present

## 2022-05-11 DIAGNOSIS — Z124 Encounter for screening for malignant neoplasm of cervix: Secondary | ICD-10-CM | POA: Diagnosis not present

## 2022-05-11 DIAGNOSIS — L680 Hirsutism: Secondary | ICD-10-CM | POA: Diagnosis not present

## 2022-05-11 DIAGNOSIS — Z0000 Encounter for general adult medical examination without abnormal findings: Secondary | ICD-10-CM | POA: Diagnosis not present
